# Patient Record
Sex: Female | Born: 1955 | Race: White | Hispanic: No | Smoking: Never smoker
Health system: Southern US, Community
[De-identification: ages and names within clinical notes are randomized; demographics above are authoritative.]

## PROBLEM LIST (undated history)

## (undated) DIAGNOSIS — I10 Essential (primary) hypertension: Secondary | ICD-10-CM

## (undated) DIAGNOSIS — E079 Disorder of thyroid, unspecified: Secondary | ICD-10-CM

## (undated) HISTORY — PX: CHOLECYSTECTOMY: SHX55

## (undated) HISTORY — PX: BREAST EXCISIONAL BIOPSY: SUR124

## (undated) HISTORY — PX: KNEE ARTHROCENTESIS: SHX991

## (undated) HISTORY — PX: ABDOMINAL HYSTERECTOMY: SHX81

---

## 2015-04-30 ENCOUNTER — Other Ambulatory Visit: Payer: Self-pay | Admitting: Physician Assistant

## 2015-04-30 DIAGNOSIS — R1310 Dysphagia, unspecified: Secondary | ICD-10-CM

## 2015-05-04 ENCOUNTER — Encounter (INDEPENDENT_AMBULATORY_CARE_PROVIDER_SITE_OTHER): Payer: Self-pay

## 2015-05-04 ENCOUNTER — Ambulatory Visit
Admission: RE | Admit: 2015-05-04 | Discharge: 2015-05-04 | Disposition: A | Discharge status: Home / Self Care | Payer: Federal, State, Local not specified - PPO | Source: Ambulatory Visit | Attending: Physician Assistant | Admitting: Physician Assistant

## 2015-05-04 DIAGNOSIS — R1310 Dysphagia, unspecified: Secondary | ICD-10-CM

## 2015-10-10 ENCOUNTER — Other Ambulatory Visit: Payer: Self-pay | Admitting: Internal Medicine

## 2015-10-10 DIAGNOSIS — E063 Autoimmune thyroiditis: Principal | ICD-10-CM

## 2015-10-10 DIAGNOSIS — E0580 Other thyrotoxicosis without thyrotoxic crisis or storm: Secondary | ICD-10-CM

## 2015-10-12 ENCOUNTER — Ambulatory Visit
Admission: RE | Admit: 2015-10-12 | Discharge: 2015-10-12 | Disposition: A | Discharge status: Home / Self Care | Payer: Federal, State, Local not specified - PPO | Source: Ambulatory Visit | Attending: Internal Medicine | Admitting: Internal Medicine

## 2015-10-12 DIAGNOSIS — E0580 Other thyrotoxicosis without thyrotoxic crisis or storm: Secondary | ICD-10-CM

## 2015-10-12 DIAGNOSIS — E063 Autoimmune thyroiditis: Principal | ICD-10-CM

## 2015-11-08 ENCOUNTER — Ambulatory Visit: Payer: Federal, State, Local not specified - PPO | Admitting: Dietician

## 2015-11-10 ENCOUNTER — Emergency Department (HOSPITAL_COMMUNITY): Payer: Federal, State, Local not specified - PPO

## 2015-11-10 ENCOUNTER — Emergency Department (HOSPITAL_COMMUNITY)
Admission: EM | Admit: 2015-11-10 | Discharge: 2015-11-10 | Disposition: A | Discharge status: Home / Self Care | Payer: Federal, State, Local not specified - PPO | Attending: Emergency Medicine | Admitting: Emergency Medicine

## 2015-11-10 ENCOUNTER — Encounter (HOSPITAL_COMMUNITY): Payer: Self-pay

## 2015-11-10 DIAGNOSIS — I10 Essential (primary) hypertension: Secondary | ICD-10-CM | POA: Insufficient documentation

## 2015-11-10 DIAGNOSIS — Y92009 Unspecified place in unspecified non-institutional (private) residence as the place of occurrence of the external cause: Secondary | ICD-10-CM | POA: Diagnosis not present

## 2015-11-10 DIAGNOSIS — Y9389 Activity, other specified: Secondary | ICD-10-CM | POA: Insufficient documentation

## 2015-11-10 DIAGNOSIS — S82041A Displaced comminuted fracture of right patella, initial encounter for closed fracture: Secondary | ICD-10-CM | POA: Insufficient documentation

## 2015-11-10 DIAGNOSIS — Z8639 Personal history of other endocrine, nutritional and metabolic disease: Secondary | ICD-10-CM | POA: Insufficient documentation

## 2015-11-10 DIAGNOSIS — S8991XA Unspecified injury of right lower leg, initial encounter: Secondary | ICD-10-CM | POA: Diagnosis present

## 2015-11-10 DIAGNOSIS — W01198A Fall on same level from slipping, tripping and stumbling with subsequent striking against other object, initial encounter: Secondary | ICD-10-CM | POA: Diagnosis not present

## 2015-11-10 DIAGNOSIS — Y998 Other external cause status: Secondary | ICD-10-CM | POA: Diagnosis not present

## 2015-11-10 DIAGNOSIS — S80211A Abrasion, right knee, initial encounter: Secondary | ICD-10-CM | POA: Insufficient documentation

## 2015-11-10 DIAGNOSIS — S82001A Unspecified fracture of right patella, initial encounter for closed fracture: Secondary | ICD-10-CM

## 2015-11-10 HISTORY — DX: Essential (primary) hypertension: I10

## 2015-11-10 HISTORY — DX: Disorder of thyroid, unspecified: E07.9

## 2015-11-10 MED ORDER — OXYCODONE-ACETAMINOPHEN 5-325 MG PO TABS
1.0000 | ORAL_TABLET | Freq: Once | ORAL | Status: AC
  Administered 2015-11-10: 1 via ORAL
  Filled 2015-11-10: qty 1

## 2015-11-10 MED ORDER — OXYCODONE-ACETAMINOPHEN 5-325 MG PO TABS: 1.0000 | ORAL_TABLET | Freq: Four times a day (QID) | ORAL | Status: AC | PRN

## 2015-11-10 MED ORDER — IBUPROFEN 800 MG PO TABS: 800.0000 mg | ORAL_TABLET | Freq: Three times a day (TID) | ORAL | Status: AC | PRN

## 2015-11-10 NOTE — ED Provider Notes (Signed)
CSN: [id number]     Arrival date & time 11/10/15  0023 History   None    Chief Complaint  Patient presents with  . Knee Pain  . Fall     (Consider location/radiation/quality/duration/timing/severity/associated sxs/prior Treatment) HPI   60 year old female with hx of thyroid disease and HTN here for evaluation of R knee injury.  Pt had a mechanical fall in which she fell at home while slipping on wet floor and striking her R knee against tile floor.  Incident happened 2 hrs ago.  She report acute onset of sharp throbbing non radiating pain to her anterior knee worsening with movement.  She felt a "pop" upon the impact and felt that her knee cap may have been popped out of place.  She was unable to bear weight afterward.  She denies hitting head or LOC.  No R hip or R ankle pain.  No specific treatment tried.  She denies any precipitating sxs prior to the fall.  No prior injury to the same knee.  She is UTD with tetanus status.  Denies numbness or weakness.  No back pain.    Past Medical History  Diagnosis Date  . Thyroid disease   . Hypertension    Past Surgical History  Procedure Laterality Date  . Abdominal hysterectomy    . Cholecystectomy    . Knee arthrocentesis Right    History reviewed. No pertinent family history. Social History  Substance Use Topics  . Smoking status: Never Smoker   . Smokeless tobacco: None  . Alcohol Use: No   OB History    No data available     Review of Systems  Constitutional: Negative for fever.  Musculoskeletal: Positive for joint swelling.  Neurological: Negative for numbness.      Allergies  Review of patient's allergies indicates no known allergies.  Home Medications   Prior to Admission medications   Not on File   BP 164/95 mmHg  Pulse 92  Temp(Src) 98.4 F (36.9 C) (Oral)  Resp 16  SpO2 99% Physical Exam  Constitutional: She appears well-developed and well-nourished. No distress.  HENT:  Head: Atraumatic.  Eyes:  Conjunctivae are normal.  Neck: Neck supple.  Cardiovascular: Intact distal pulses.   Musculoskeletal: She exhibits tenderness (R knee: small abrasion noted to anterior knee.  patella is located, with crepitus and moderate surrounding edema.  Pain with knee flexion/extension.  negative anterior/posterior drawer test.  neg varus/valgus tenderness).  R hip and R ankle are nontender with FROM.   Neurological: She is alert.  Skin: No rash noted.  Psychiatric: She has a normal mood and affect.  Nursing note and vitals reviewed.   ED Course  Procedures (including critical care time) Labs Review Labs Reviewed - No data to display  Imaging Review Dg Knee Complete 4 Views Right  11/10/2015  CLINICAL DATA:  Pain after a fall. EXAM: RIGHT KNEE - COMPLETE 4+ VIEW COMPARISON:  None. FINDINGS: Comminuted and distracted fractures through the body of the patella with associated prepatellar soft tissue swelling. Minimal right knee effusion. No other fractures or dislocation identified. IMPRESSION: Comminuted and distracted fractures of the patella with associated soft tissue swelling. Electronically Signed   By: Burman NievesWilliam  Stevens M.D.   On: 11/10/2015 02:29   I have personally reviewed and evaluated these images and lab results as part of my medical decision-making.   EKG Interpretation None      MDM   Final diagnoses:  Patella fracture, right, closed, initial encounter  BP 164/95 mmHg  Pulse 92  Temp(Src) 98.4 F (36.9 C) (Oral)  Resp 16  SpO2 99%   12:49 AM Pt had a mechanical fall, injuring her R knee.  Pt suffered a comminute and distracted patella fracture.  This is a closed injury.  Abrasion to anterior aspect of knee were noted, pt UTD with tetanus.  Pain medication given.  Knee immobilizer and crutches provided.  Ortho referral provided.    Fayrene Helper, PA-C 11/10/15 1607  Kristen N Ward, DO 11/10/15 2307

## 2015-11-10 NOTE — ED Notes (Signed)
Pt able to ambulate with crutches.

## 2015-11-10 NOTE — ED Notes (Signed)
Ortho at bedside.

## 2015-11-10 NOTE — ED Notes (Signed)
Patient able to ambulate independently with crutches.  Signature pad in room malfunctioned.  Pt verbalized understanding of discharge instructions, prescriptions, and follow up care.  All questions answered.

## 2015-11-10 NOTE — ED Notes (Addendum)
Patient fell at home on tile floor and hit right knee.  Patient is reluctant to move her knee.  EMS states her pain was at a 6 and applied ice.  No obvious deformities.  No pain on palpation.  Patient A&Ox4

## 2015-11-10 NOTE — Discharge Instructions (Signed)
Knee Fracture, Adult °A knee fracture is a break in a bone of the knee. The break may be in the kneecap (patella), the lower part of the thigh bone (femur), or the upper part of the shin bone (tibia). °There are several types of fractures. They include: °· Stable. In this type of fracture, the bones of the knee remain in place after the break. °· Displaced. In this type of fracture, the bones no longer line up after the break. °· Comminuted. In this type of fracture, the bone breaks into several pieces. °· Open. In this type of fracture, the broken bone comes through the skin. °CAUSES °This injury is usually caused by a fall. This injury can happen because of the impact of the fall or from a violent contraction of the leg muscles before you hit the ground. It can also result from a car accident or a collision with a hard surface. °RISK FACTORS °This injury is more likely to develop in people who: °· Are female. °· Are 20-50 years old. °· Participate in high-energy sports. °· Have a condition that weakens the bones, such as osteoporosis. °· Have had a knee replacement. °SYMPTOMS °Symptoms of this injury include: °· Pain. °· Swelling. °· Bruising. °· Inability to bend your knee. °· Misshapen knee. °· Inability to walk. °· Inability to use your injured leg to support your body weight. °DIAGNOSIS °This injury is diagnosed with a physical exam. Your health care provider may also order: °· Imaging studies, such as an X-ray, CT scan, MRI scan, or ultrasound. °· A procedure called arthroscopy to view the inside of your knee with a small camera. °TREATMENT °Treatment for this injury may involve: °· Wearing a splint until swelling goes down. °· Wearing a cast to keep the fractured bone from moving while it heals. A cast is usually put on after swelling has gone down. °· Surgery to move a bone back into place. °HOME CARE INSTRUCTIONS °If You Have a Splint: °· Wear it as directed by your health care provider. Remove it only as  directed by your health care provider. °· Loosen the splint if your toes become numb and tingle, or if they turn cold and blue. °· Do not put pressure on any part of your splint. °If You Have a Cast: °· Do not stick anything inside the cast to scratch your skin. Doing that increases your risk of infection. °· Check the skin around the cast every day. Report any concerns to your health care provider. You may put lotion on dry skin around the edges of the cast. Do not apply lotion to the skin underneath the cast. °Bathing °· Cover the cast or splint with a watertight plastic bag to protect it from water while you take a bath or a shower. Do not let the cast or splint get wet. °Managing Pain, Stiffness, and Swelling °· If directed, apply ice to the injured area: °¨ Put ice in a plastic bag. °¨ Place a towel between your skin and the bag. °¨ Leave the ice on for 20 minutes, 2-3 times a day. °· Move your toes often to avoid stiffness and to lessen swelling. °· Raise the injured area above the level of your heart while you are lying down. °Driving °· Do not drive or operate heavy machinery while taking pain medicine. °· Do not drive while wearing a cast or splint on a hand or foot that you use for driving. °Activity °· Return to your normal activities as directed   by your health care provider. Ask your health care provider what activities are safe for you. General Instructions  Do not put pressure on any part of the cast or splint until it is fully hardened. This may take several hours.  Keep the cast or splint clean and dry.  Do not use any tobacco products, including cigarettes, chewing tobacco, or electronic cigarettes. Tobacco can delay bone healing. If you need help quitting, ask your health care provider.  Take medicines only as directed by your health care provider.  Keep all follow-up visits as directed by your health care provider. This is important. SEEK MEDICAL CARE IF:  You have knee pain and  swelling.  You have trouble walking.  Your cast becomes wet or damaged or suddenly feels too tight. SEEK IMMEDIATE MEDICAL CARE IF:  Your pain and swelling get worse.  You have severe pain below the fracture.  Your skin or toenails turn blue or gray, feel cold, or become numb.  You have fluid, blood, or pus coming from under your cast.   This information is not intended to replace advice given to you by your health care provider. Make sure you discuss any questions you have with your health care provider.   Document Released: 04/22/2006 Document Revised: 06/30/2014 Document Reviewed: 02/08/2014 Elsevier Interactive Patient Education 2016 [employer] Knee Immobilizer A knee immobilizer, also known as a knee brace, supports your knee and keeps you from bending it. The knee brace may be a splint or a cast. Wear your knee brace and remove the knee brace as told by your doctor. HOME CARE   Use powder (such as baby or talcum powder) to help with sweat and rubbing.  Adjust the brace as often as needed while wearing it. It should be firm but not tight. Signs that the brace is too tight include:  Puffiness (swelling).  Numbness.  Color change in your foot or ankle.  Increased pain.  While resting, keep your leg above the level of your heart. Use pillows for support.  Remove the knee brace to bathe and sleep. GET HELP RIGHT AWAY IF:   There is more pain or puffiness in the knee, foot, or ankle.  You have problems because of the brace or the brace breaks. MAKE SURE YOU:   Understand these instructions.  Will watch this condition.  Will get help right away if you are not doing well or get worse.   This information is not intended to replace advice given to you by your health care provider. Make sure you discuss any questions you have with your health care provider.   Document Released: 03/18/2008 Document Revised: 06/14/2013 Document Reviewed: 01/31/2013 Elsevier  Interactive Patient Education [employer]

## 2015-12-13 ENCOUNTER — Ambulatory Visit: Payer: Federal, State, Local not specified - PPO | Admitting: Dietician

## 2017-06-10 IMAGING — CR DG KNEE COMPLETE 4+V*R*
4 series · 4 of 4 positions shown · non-contrast
Comparison: None.

CLINICAL DATA: Pain after a fall.

EXAM:
RIGHT KNEE - COMPLETE 4+ VIEW

[knee ap]
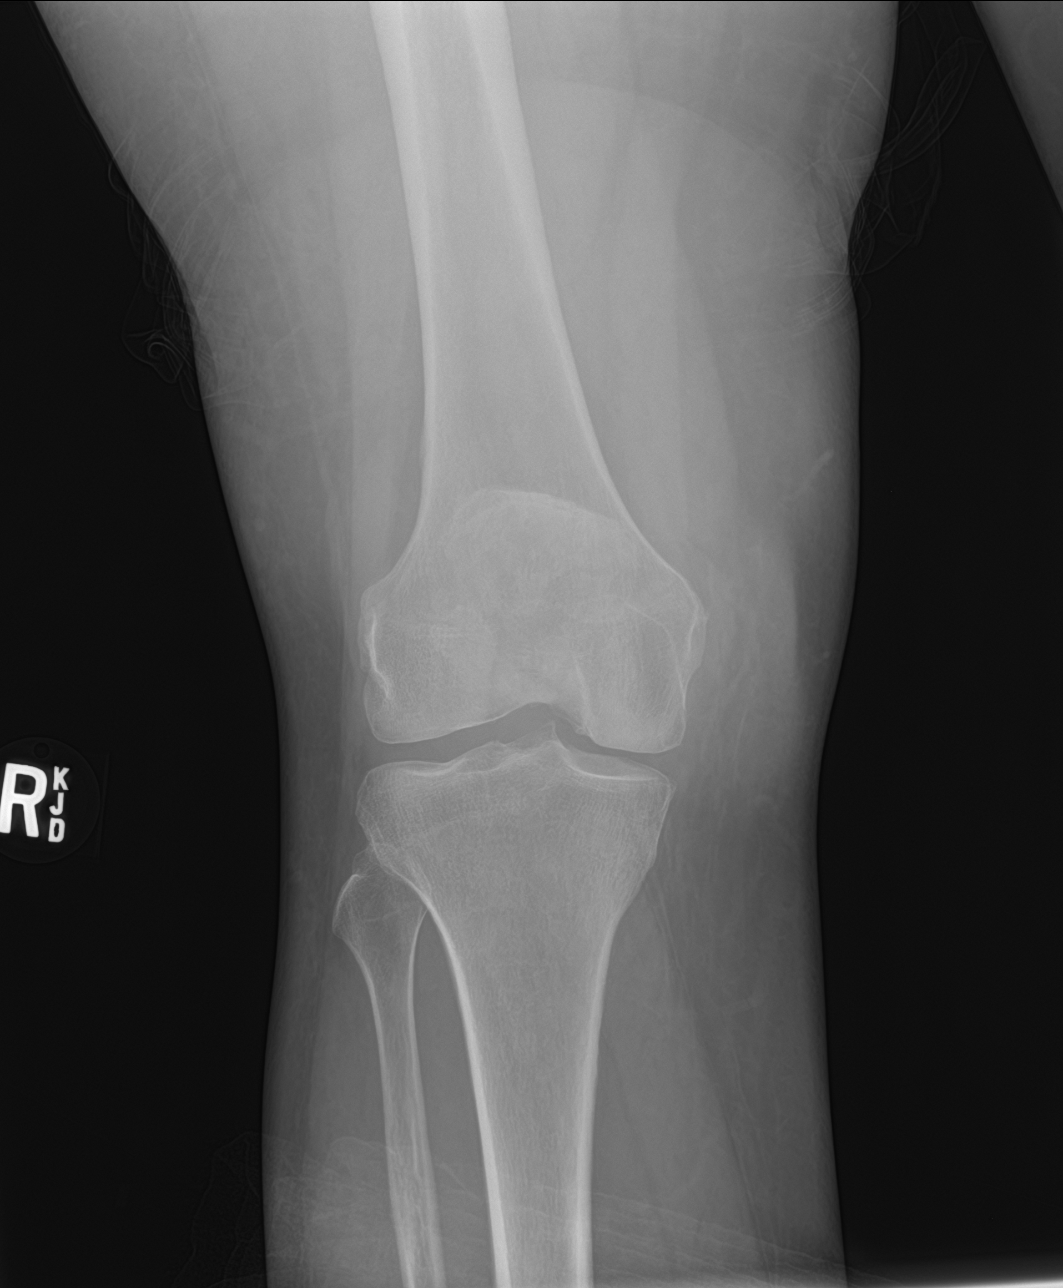

[knee lat]
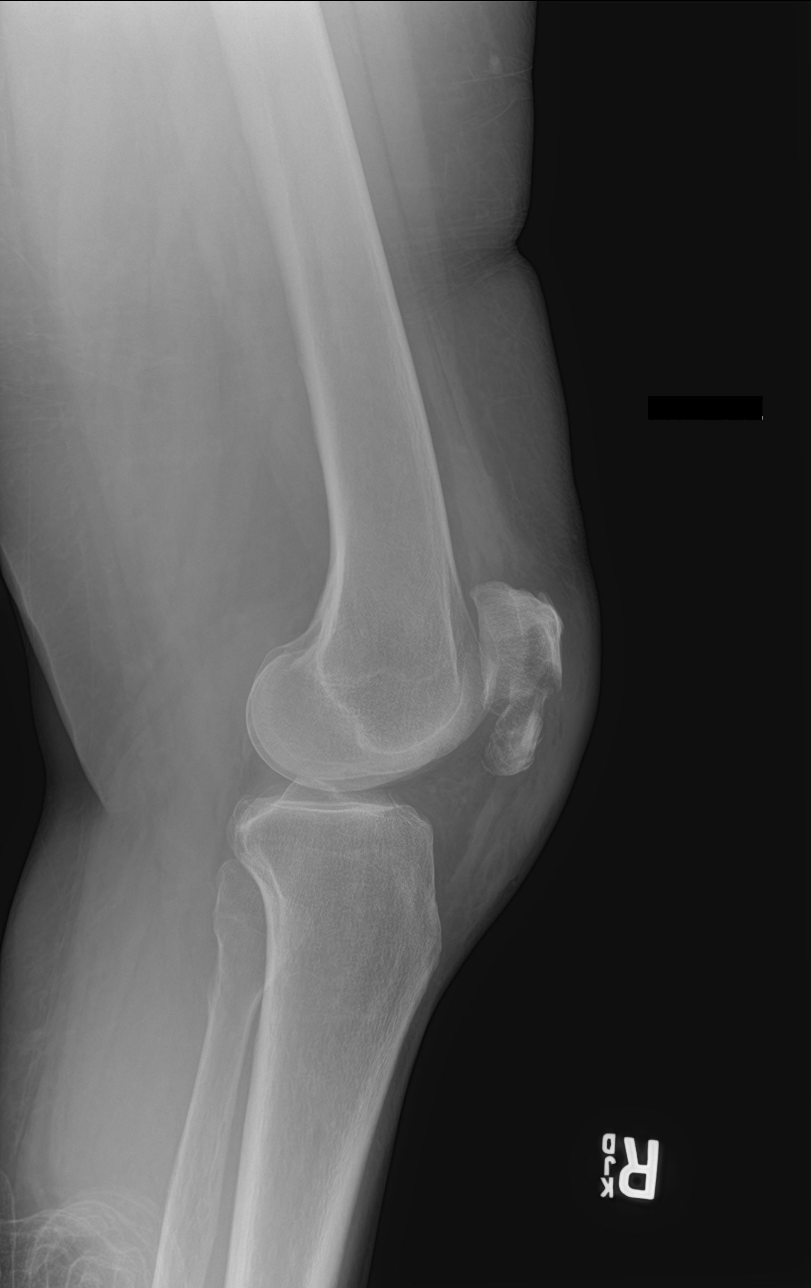

[knee obl (1 of 2)]
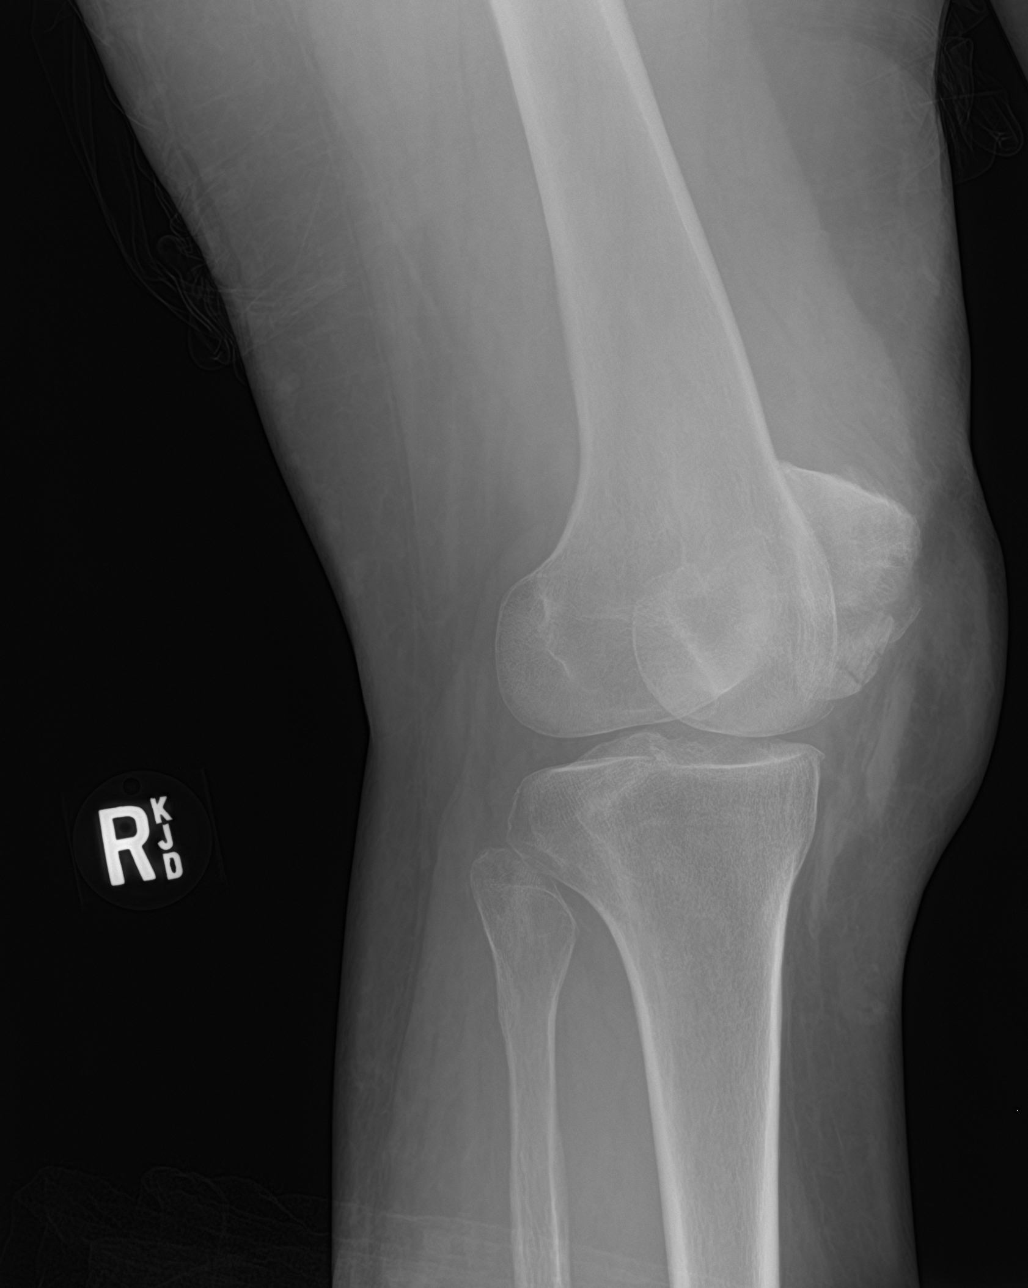

[knee obl (2 of 2)]
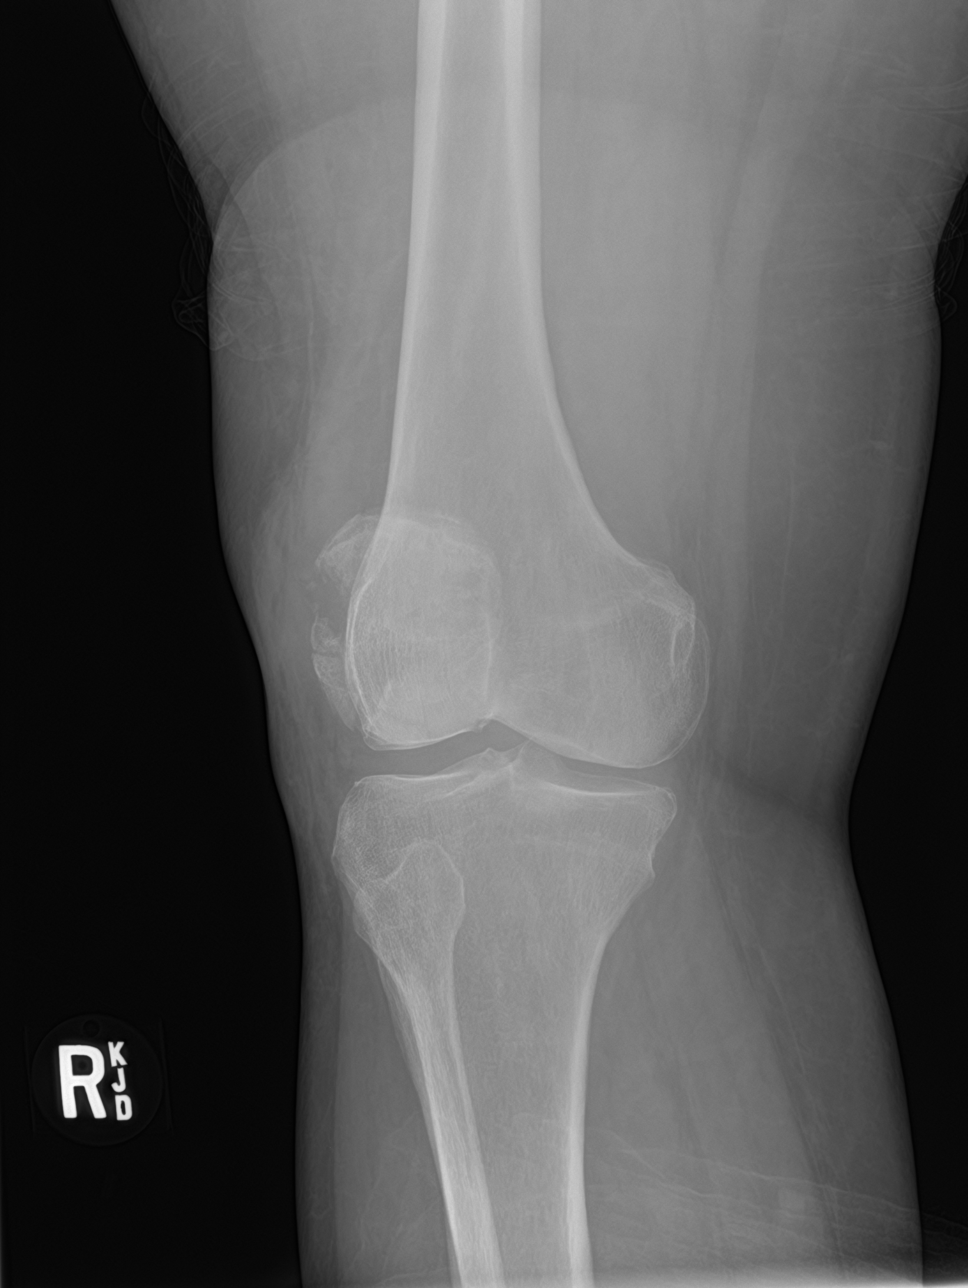

[4 of 4 positions shown; findings below may reference images not displayed]

FINDINGS: Comminuted and distracted fractures through the body of the patella
with associated prepatellar soft tissue swelling. Minimal right knee
effusion. No other fractures or dislocation identified.
IMPRESSION: Comminuted and distracted fractures of the patella with associated
soft tissue swelling.

## 2017-12-07 ENCOUNTER — Other Ambulatory Visit: Payer: Self-pay | Admitting: Obstetrics & Gynecology

## 2017-12-08 ENCOUNTER — Other Ambulatory Visit: Payer: Self-pay | Admitting: Obstetrics & Gynecology

## 2017-12-08 DIAGNOSIS — N644 Mastodynia: Secondary | ICD-10-CM

## 2017-12-16 ENCOUNTER — Inpatient Hospital Stay: Admission: RE | Admit: 2017-12-16 | Payer: Federal, State, Local not specified - PPO | Source: Ambulatory Visit

## 2017-12-16 ENCOUNTER — Other Ambulatory Visit: Payer: Federal, State, Local not specified - PPO

## 2017-12-28 ENCOUNTER — Other Ambulatory Visit: Payer: Federal, State, Local not specified - PPO

## 2017-12-31 ENCOUNTER — Ambulatory Visit
Admission: RE | Admit: 2017-12-31 | Discharge: 2017-12-31 | Disposition: A | Discharge status: Home / Self Care | Payer: Federal, State, Local not specified - PPO | Source: Ambulatory Visit | Attending: Obstetrics & Gynecology | Admitting: Obstetrics & Gynecology

## 2017-12-31 ENCOUNTER — Ambulatory Visit: Payer: Federal, State, Local not specified - PPO

## 2017-12-31 DIAGNOSIS — N644 Mastodynia: Secondary | ICD-10-CM

## 2018-06-01 ENCOUNTER — Other Ambulatory Visit: Payer: Self-pay | Admitting: Internal Medicine

## 2018-06-01 DIAGNOSIS — E063 Autoimmune thyroiditis: Secondary | ICD-10-CM

## 2018-06-01 DIAGNOSIS — G9349 Other encephalopathy: Principal | ICD-10-CM

## 2018-06-01 DIAGNOSIS — E042 Nontoxic multinodular goiter: Secondary | ICD-10-CM

## 2018-06-02 ENCOUNTER — Ambulatory Visit
Admission: RE | Admit: 2018-06-02 | Discharge: 2018-06-02 | Disposition: A | Discharge status: Home / Self Care | Payer: Federal, State, Local not specified - PPO | Source: Ambulatory Visit | Attending: Internal Medicine | Admitting: Internal Medicine

## 2018-06-02 DIAGNOSIS — G9349 Other encephalopathy: Principal | ICD-10-CM

## 2018-06-02 DIAGNOSIS — E063 Autoimmune thyroiditis: Secondary | ICD-10-CM

## 2018-06-02 DIAGNOSIS — E042 Nontoxic multinodular goiter: Secondary | ICD-10-CM

## 2020-02-17 IMAGING — US US THYROID
1 series · 13 of 25 positions shown · non-contrast
Comparison: 10/12/2015

CLINICAL DATA: Other.  Hashimoto's thyroiditis.

EXAM:
THYROID ULTRASOUND
TECHNIQUE: Ultrasound examination of the thyroid gland and adjacent soft
tissues was performed.

[Series 1: us thyroid · 0.05mm/px · 13 of 44 slices shown]
[im 1/44]
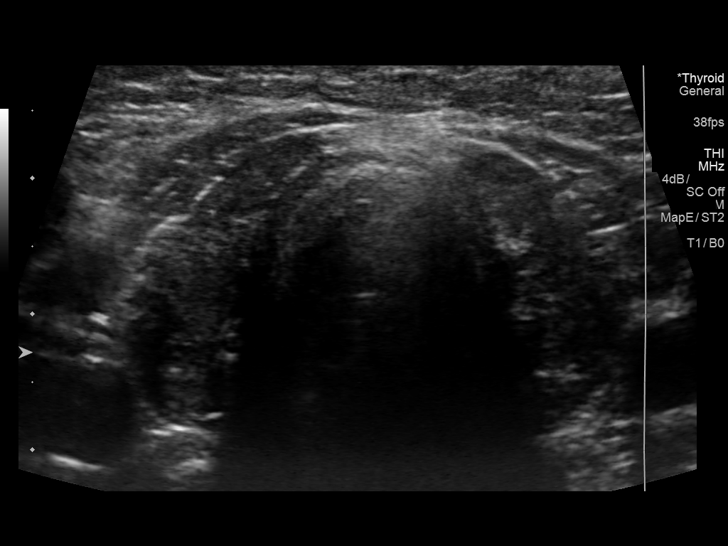
[im 4/44]
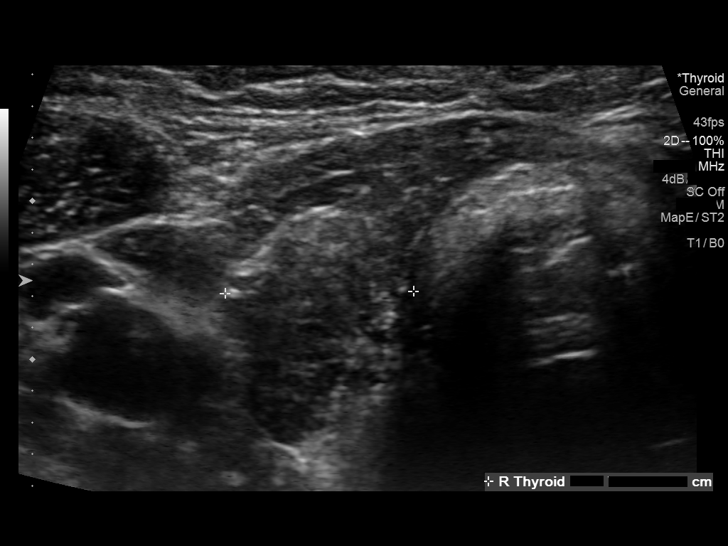
[im 8/44]
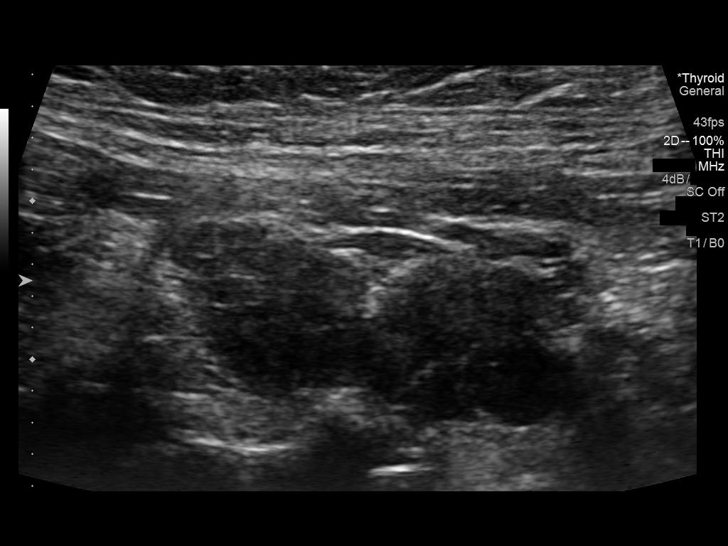
[im 11/44]
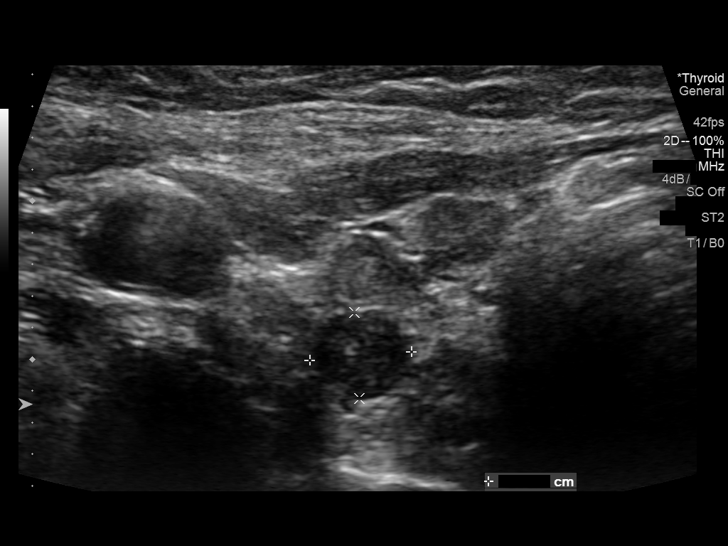
[im 15/44]
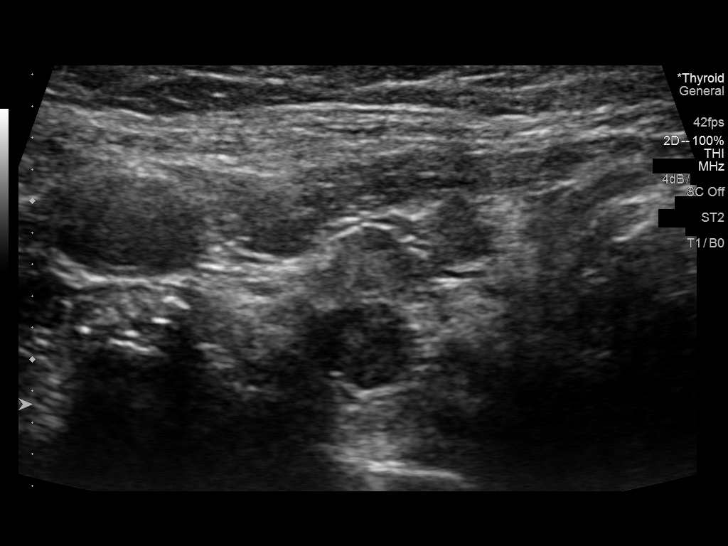
[im 18/44]
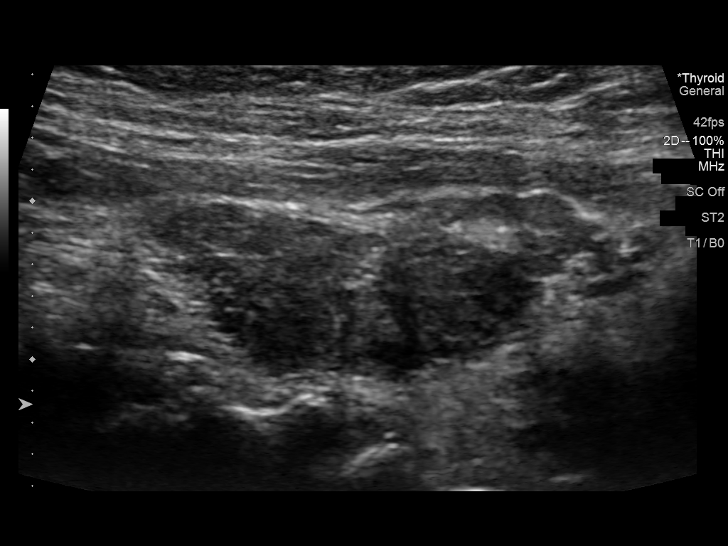
[im 22/44]
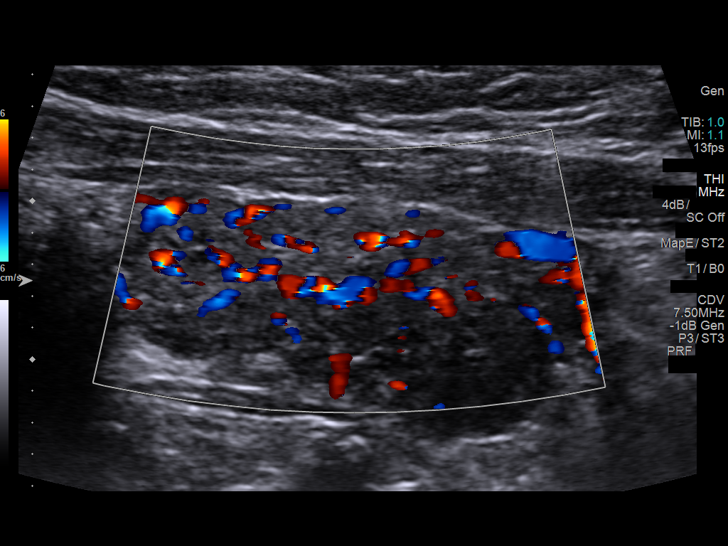
[im 26/44]
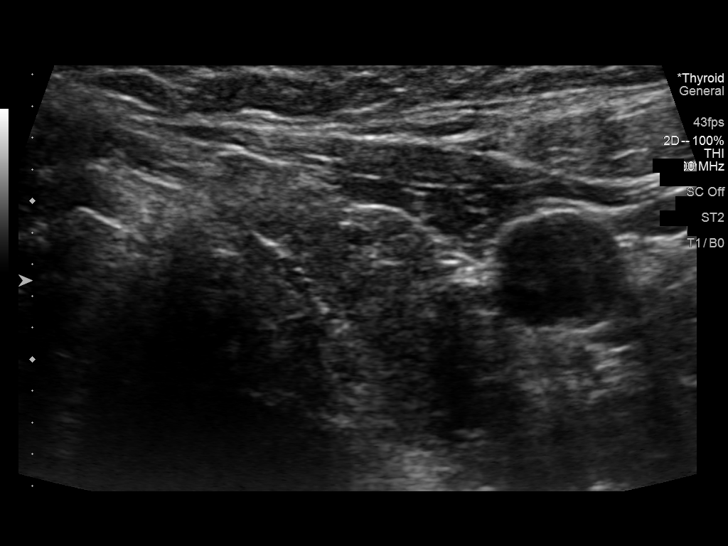
[im 29/44]
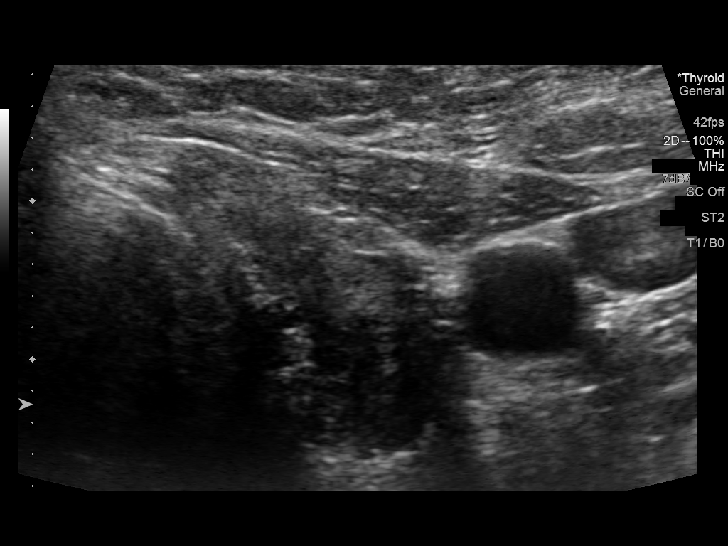
[im 33/44]
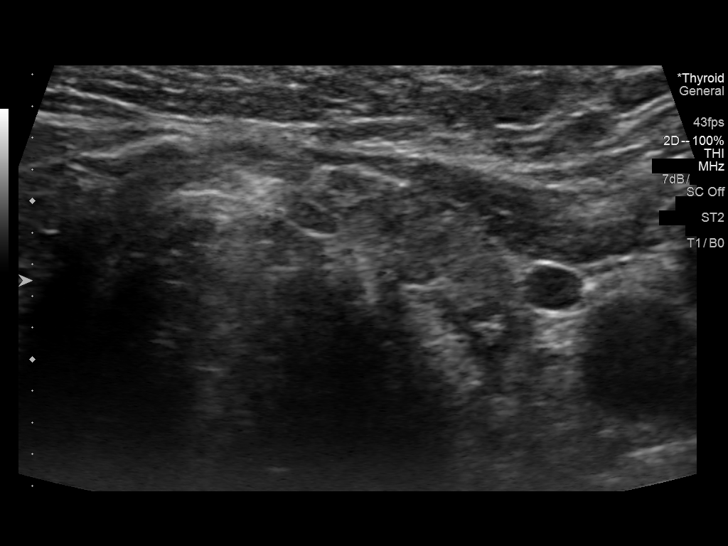
[im 36/44]
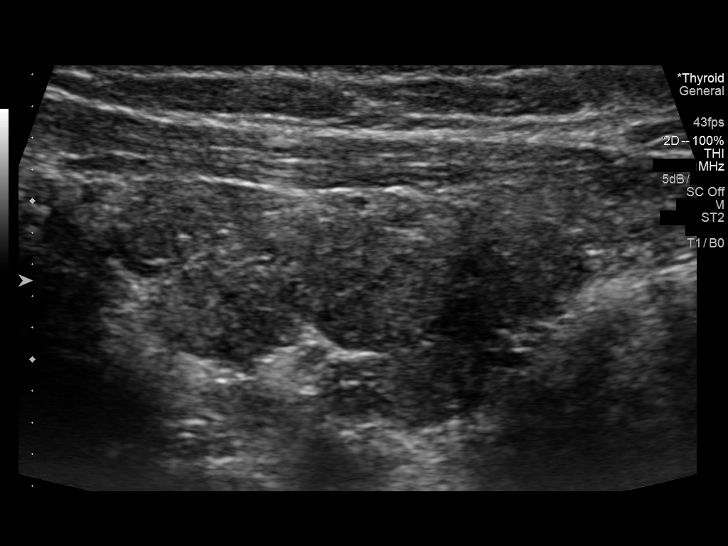
[im 40/44]
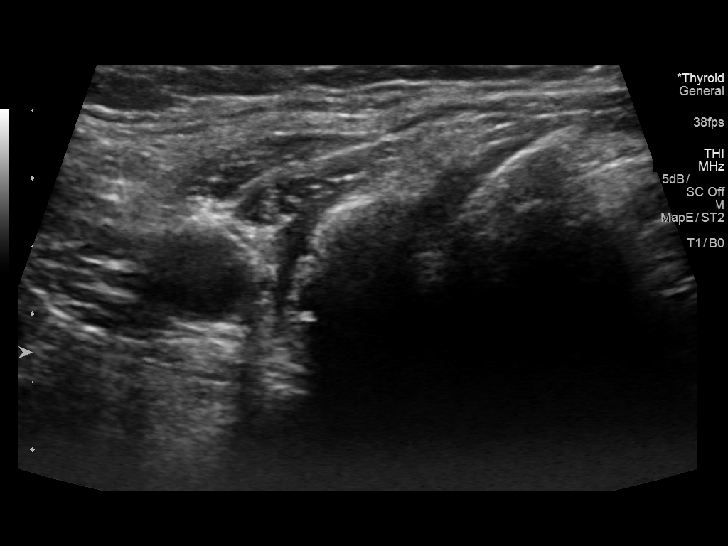
[im 44/44]
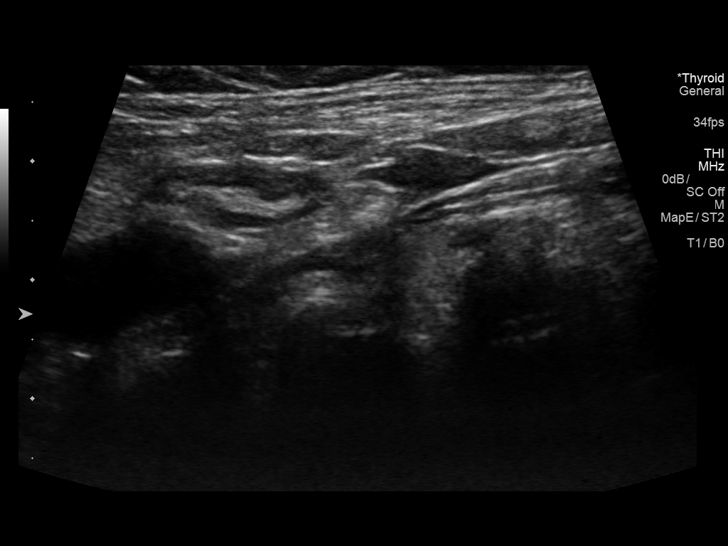

[13 of 25 positions shown; findings below may reference images not displayed]

FINDINGS: Parenchymal Echotexture: Markedly heterogenous

Isthmus: Normal in size measures 0.2 cm in diameter, unchanged

Right lobe: Atrophic measuring 2.5 x 1.0 x 1.2 cm, unchanged,
previously, 2.7 x 1.3 x 0.9 cm

Left lobe: Atrophic measuring 3.3 x 1.5 x 1.2 cm, unchanged,
previously, 3.4 x 1.5 x 1.2 cm

_________________________________________________________

Estimated total number of nodules >/= 1 cm: 0

Number of spongiform nodules >/=  2 cm not described below (TR1): 0

Number of mixed cystic and solid nodules >/= 1.5 cm not described
below (TR2): 0

_________________________________________________________

Nodule # 1:

Prior biopsy: No

Location: Right; Inferior

Maximum size: 0.6 cm; Other 2 dimensions: 0.5 x 0.5 cm, previously,
1.3 x 1.1 x 0.9 cm

Composition: solid/almost completely solid (2)

Echogenicity: hypoechoic (2)

Shape: not taller-than-wide (0)

Margins: ill-defined (0)

Echogenic foci: none (0)

ACR TI-RADS total points: 4.

ACR TI-RADS risk category:  TR4 (4-6 points).

Significant change in size (>/= 20% in two dimensions and minimal
increase of 2 mm): Yes - nodule has decreased in size compared to
the [DATE] examination.

Change in features: No

Change in ACR TI-RADS risk category: No

ACR TI-RADS recommendations:

Given size (<0.9 cm) and appearance, this nodule does NOT meet
TI-RADS criteria for biopsy or dedicated follow-up.

_________________________________________________________

Questioned approximately 0.9 x 0.8 x 0.7 cm hypoechoic nodule
involving the inferior pole the left lobe of the thyroid is favored
to represent lobular extension of markedly heterogeneous thyroid
parenchyma as opposed to a discrete nodule as this structure lacks
defined borders on both provided transverse and longitudinal images.
IMPRESSION: 1. Similar appearing atrophic and markedly heterogeneous thyroid
gland, nonspecific though could compatible with provided history of
Hashimoto's thyroiditis.
2. No definitive new or enlarging thyroid nodules.
3. Previously noted right-sided thyroid nodule has decreased in size
in the interval and as such does not meet imaging criteria to
recommend percutaneous sampling or continued dedicated follow-up.

The above is in keeping with the ACR TI-RADS recommendations - [HOSPITAL] 6526;[DATE].
